# Patient Record
Sex: Female | Born: 1975 | Race: Black or African American | Hispanic: No | Marital: Single | Smoking: Never smoker
Health system: Southern US, Community
[De-identification: ages and names within clinical notes are randomized; demographics above are authoritative.]

---

## 2008-08-30 ENCOUNTER — Encounter: Payer: Self-pay | Admitting: Obstetrics & Gynecology

## 2008-08-30 ENCOUNTER — Inpatient Hospital Stay (HOSPITAL_COMMUNITY): Admission: AD | Admit: 2008-08-30 | Discharge: 2008-08-30 | Payer: Self-pay | Admitting: Obstetrics & Gynecology

## 2008-09-06 ENCOUNTER — Inpatient Hospital Stay (HOSPITAL_COMMUNITY): Admission: AD | Admit: 2008-09-06 | Discharge: 2008-09-06 | Payer: Self-pay | Admitting: Obstetrics & Gynecology

## 2009-05-10 ENCOUNTER — Inpatient Hospital Stay (HOSPITAL_COMMUNITY): Admission: AD | Admit: 2009-05-10 | Discharge: 2009-05-10 | Payer: Self-pay | Admitting: Family Medicine

## 2009-05-24 ENCOUNTER — Ambulatory Visit: Payer: Self-pay | Admitting: Advanced Practice Midwife

## 2009-05-24 ENCOUNTER — Inpatient Hospital Stay (HOSPITAL_COMMUNITY): Admission: AD | Admit: 2009-05-24 | Discharge: 2009-05-24 | Payer: Self-pay | Admitting: Obstetrics and Gynecology

## 2010-05-08 LAB — URINALYSIS, ROUTINE W REFLEX MICROSCOPIC
Bilirubin Urine: NEGATIVE
Protein, ur: NEGATIVE mg/dL
Specific Gravity, Urine: 1.02 (ref 1.005–1.030)

## 2010-05-08 LAB — CBC
HCT: 41.9 % (ref 36.0–46.0)
Hemoglobin: 14 g/dL (ref 12.0–15.0)
MCHC: 33.5 g/dL (ref 30.0–36.0)
Platelets: 281 10*3/uL (ref 150–400)
RBC: 4.27 MIL/uL (ref 3.87–5.11)

## 2010-05-08 LAB — URINE MICROSCOPIC-ADD ON

## 2010-05-08 LAB — HCG, QUANTITATIVE, PREGNANCY: hCG, Beta Chain, Quant, S: 61884 m[IU]/mL — ABNORMAL HIGH (ref <5)

## 2010-05-08 LAB — POCT PREGNANCY, URINE: Preg Test, Ur: POSITIVE

## 2010-05-21 LAB — WET PREP, GENITAL
Trich, Wet Prep: NONE SEEN
Yeast Wet Prep HPF POC: NONE SEEN

## 2010-05-21 LAB — CBC
Hemoglobin: 13.6 g/dL (ref 12.0–15.0)
MCV: 97.2 fL (ref 78.0–100.0)
Platelets: 304 10*3/uL (ref 150–400)
RBC: 4.08 MIL/uL (ref 3.87–5.11)
RDW: 12.5 % (ref 11.5–15.5)

## 2010-05-21 LAB — GC/CHLAMYDIA PROBE AMP, GENITAL: Chlamydia, DNA Probe: NEGATIVE

## 2013-05-14 ENCOUNTER — Emergency Department (HOSPITAL_COMMUNITY)
Admission: EM | Admit: 2013-05-14 | Discharge: 2013-05-15 | Disposition: A | Discharge status: Home / Self Care | Payer: BC Managed Care – PPO | Attending: Emergency Medicine | Admitting: Emergency Medicine

## 2013-05-14 ENCOUNTER — Emergency Department (HOSPITAL_COMMUNITY): Payer: BC Managed Care – PPO

## 2013-05-14 ENCOUNTER — Encounter (HOSPITAL_COMMUNITY): Payer: Self-pay | Admitting: Emergency Medicine

## 2013-05-14 DIAGNOSIS — R6883 Chills (without fever): Secondary | ICD-10-CM | POA: Insufficient documentation

## 2013-05-14 DIAGNOSIS — R079 Chest pain, unspecified: Secondary | ICD-10-CM | POA: Insufficient documentation

## 2013-05-14 DIAGNOSIS — M542 Cervicalgia: Secondary | ICD-10-CM | POA: Insufficient documentation

## 2013-05-14 DIAGNOSIS — Z880 Allergy status to penicillin: Secondary | ICD-10-CM | POA: Insufficient documentation

## 2013-05-14 DIAGNOSIS — R0602 Shortness of breath: Secondary | ICD-10-CM | POA: Insufficient documentation

## 2013-05-14 DIAGNOSIS — R51 Headache: Secondary | ICD-10-CM | POA: Insufficient documentation

## 2013-05-14 DIAGNOSIS — Z3202 Encounter for pregnancy test, result negative: Secondary | ICD-10-CM | POA: Insufficient documentation

## 2013-05-14 DIAGNOSIS — Z791 Long term (current) use of non-steroidal anti-inflammatories (NSAID): Secondary | ICD-10-CM | POA: Insufficient documentation

## 2013-05-14 DIAGNOSIS — R519 Headache, unspecified: Secondary | ICD-10-CM

## 2013-05-14 DIAGNOSIS — E669 Obesity, unspecified: Secondary | ICD-10-CM | POA: Insufficient documentation

## 2013-05-14 LAB — BASIC METABOLIC PANEL
BUN: 13 mg/dL (ref 6–23)
CALCIUM: 9.4 mg/dL (ref 8.4–10.5)
CO2: 23 meq/L (ref 19–32)
Chloride: 103 mEq/L (ref 96–112)
Creatinine, Ser: 0.96 mg/dL (ref 0.50–1.10)
GFR, EST AFRICAN AMERICAN: 86 mL/min — AB (ref >90)
GFR, EST NON AFRICAN AMERICAN: 74 mL/min — AB (ref >90)
GLUCOSE: 108 mg/dL — AB (ref 70–99)
POTASSIUM: 3.8 meq/L (ref 3.7–5.3)
SODIUM: 139 meq/L (ref 137–147)

## 2013-05-14 LAB — CBC
HEMATOCRIT: 41.1 % (ref 36.0–46.0)
Hemoglobin: 14.2 g/dL (ref 12.0–15.0)
MCH: 32.6 pg (ref 26.0–34.0)
MCHC: 34.5 g/dL (ref 30.0–36.0)
MCV: 94.3 fL (ref 78.0–100.0)
Platelets: 320 10*3/uL (ref 150–400)
RBC: 4.36 MIL/uL (ref 3.87–5.11)
RDW: 12.4 % (ref 11.5–15.5)
WBC: 8.7 10*3/uL (ref 4.0–10.5)

## 2013-05-14 LAB — POC URINE PREG, ED: PREG TEST UR: NEGATIVE

## 2013-05-14 MED ORDER — MORPHINE SULFATE 4 MG/ML IJ SOLN
4.0000 mg | Freq: Once | INTRAMUSCULAR | Status: AC
  Administered 2013-05-15: 4 mg via INTRAVENOUS
  Filled 2013-05-14: qty 1

## 2013-05-14 MED ORDER — ASPIRIN 325 MG PO TABS
325.0000 mg | ORAL_TABLET | ORAL | Status: AC
  Administered 2013-05-14: 325 mg via ORAL
  Filled 2013-05-14: qty 1

## 2013-05-14 NOTE — ED Notes (Signed)
Patient presents with c/o left chest pain that started a short time ago,  Woke up with a headache about 5p.  Now c/o left chest pain that feels better when she pushes on it.  Also states that her left arm ws feeling cold

## 2013-05-14 NOTE — ED Provider Notes (Signed)
CSN: [id number]     Arrival date & time 05/14/13  1938 History   First MD Initiated Contact with Patient 05/14/13 2334     Chief Complaint  Patient presents with  . Chest Pain     (Consider location/radiation/quality/duration/timing/severity/associated sxs/prior Treatment) HPI Comments: 38 year old female, known medical history other than a penicillin allergy presents with a complaint of left-sided chest pain. This started approximately 4 hours ago when the patient arrived to work at the hospital. 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 She has also had some neck pain on the left. When she arrived at work and got settled and she developed acute onset of left upper chest pain described as a sharp and stabbing pain with some radiation to her back, associated shortness of breath and some abnormal sensations in her left arm including feeling like it was cold. She denies any cardiac risk factors other than family history as her father and mother both have cardiac disease, her other had cardiac disease at an early age. She has no risk factors for pulmonary embolism other than arthroscopically on her knee in August of 2014. She has no abdominal pain, no nausea or vomiting, no diaphoresis, denies swelling of the legs, rashes, diarrhea, dysuria, cough or fever. The symptoms have been persistent over the last 4 hours, it does seem to get worse with taking a deep breath.  Patient is a 38 y.o. female presenting with chest pain. The history is provided by the patient.  Chest Pain   History reviewed. No pertinent past medical history. History reviewed. No pertinent past surgical history. History reviewed. No pertinent family history. History  Substance Use Topics  . Smoking status: Never Smoker   . Smokeless tobacco: Never Used  . Alcohol Use: No   OB History   Grav Para Term Preterm  Abortions TAB SAB Ect Mult Living                 Review of Systems  Cardiovascular: Positive for chest pain.  All other systems reviewed and are negative.      Allergies  Penicillins  Home Medications   Current Outpatient Rx  Name  Route  Sig  Dispense  Refill  . naproxen (NAPROSYN) 500 MG tablet   Oral   Take 1 tablet (500 mg total) by mouth 2 (two) times daily with a meal.   30 tablet   0    BP 118/72  Pulse 63  Temp(Src) 98.2 F (36.8 C) (Oral)  Resp 22  Ht 5\' 6"  (1.676 m)  Wt 240 lb (108.863 kg)  BMI 38.76 kg/m2  SpO2 100% Physical Exam  Nursing note and vitals reviewed. Constitutional: She appears well-developed and well-nourished. No distress.  HENT:  Head: Normocephalic and atraumatic.  Mouth/Throat: Oropharynx is clear and moist. No oropharyngeal exudate.  Eyes: Conjunctivae and EOM are normal. Pupils are equal, round, and reactive to light. Right eye exhibits no discharge. Left eye exhibits no discharge. No scleral icterus.  Neck: Normal range of motion. Neck supple. No JVD present. No thyromegaly present.  Cardiovascular: Normal rate, regular rhythm, normal heart sounds and intact distal pulses.  Exam reveals no gallop and no friction rub.   No murmur heard. Pulmonary/Chest: Effort normal and breath sounds normal. No respiratory distress. She has no wheezes. She has no rales.  The patient is unable to take deep breaths  on exam secondary to reproducible pain  Abdominal: Soft. Bowel sounds are normal. She exhibits no distension and no mass. There is no tenderness.  Musculoskeletal: Normal range of motion. She exhibits no edema and no tenderness.  Lymphadenopathy:    She has no cervical adenopathy.  Neurological: She is alert. Coordination normal.  Skin: Skin is warm and dry. No rash noted. No erythema.  Psychiatric: She has a normal mood and affect. Her behavior is normal.    ED Course  Procedures (including critical care time) Labs Review Labs  Reviewed  BASIC METABOLIC PANEL - Abnormal; Notable for the following:    Glucose, Bld 108 (*)    GFR calc non Af Amer 74 (*)    GFR calc Af Amer 86 (*)    All other components within normal limits  CBC  I-STAT TROPOININ, ED  POC URINE PREG, ED   Imaging Review Dg Chest 2 View  05/14/2013   CLINICAL DATA:  Midsternal pain, short of breath  EXAM: CHEST  2 VIEW  COMPARISON:  None.  FINDINGS: Nonspecific subtle patchy opacity in the right lower lobe. Inspiratory volumes are low. Cardiac and mediastinal contours are within normal limits mild central airway thickening/ peribronchial cuffing. No pleural effusion or pneumothorax. No acute osseous abnormality.  IMPRESSION: 1. Low inspiratory volumes with nonspecific right lower lobe patchy opacity. Differential considerations include both infiltrate/pneumonia and atelectasis given the overall low lung volumes. 2. Mild central airway thickening and peribronchial cuffing. Differential considerations include bronchitis, viral respiratory illness and inflammatory conditions such as asthma.   Electronically Signed   By: Malachy Moan M.D.   On: 05/14/2013 20:49   Ct Angio Chest Pe W/cm &/or Wo Cm  05/15/2013   CLINICAL DATA:  Left chest pain, headache.  EXAM: CT ANGIOGRAPHY CHEST WITH CONTRAST  TECHNIQUE: Multidetector CT imaging of the chest was performed using the standard protocol during bolus administration of intravenous contrast. Multiplanar CT image reconstructions and MIPs were obtained to evaluate the vascular anatomy.  CONTRAST:  OMNIPAQUE IOHEXOL 350 MG/ML SOLN  COMPARISON:  DG CHEST 2 VIEW dated 05/14/2013  FINDINGS: Adequate contrast opacification of the pulmonary artery's. Mild respiratory motion degraded examination. Main pulmonary artery is not enlarged. No pulmonary arterial filling defects to the level of the subsegmental branches.  Heart and pericardium are unremarkable, no right heart strain. Thoracic aorta is normal course and caliber,  unremarkable. No lymphadenopathy by CT size criteria. Tracheobronchial tree is patent, no pneumothorax. No pleural effusions, focal consolidations, pulmonary nodules or masses. Minimal dependent atelectasis.  Included view of the abdomen is unremarkable. Visualized soft tissues and included osseous structures appear normal.  Review of the MIP images confirms the above findings.  IMPRESSION: No pulmonary embolism nor acute cardiopulmonary process on this respiratory motion degraded examination.   Electronically Signed   By: Awilda Metro   On: 05/15/2013 02:08     EKG Interpretation   Date/Time:  Thursday May 14 2013 19:39:04 EDT Ventricular Rate:  83 PR Interval:  132 QRS Duration: 74 QT Interval:  358 QTC Calculation: 420 R Axis:   54 Text Interpretation:  Normal sinus rhythm Normal ECG No old tracing to  compare Confirmed by Analyse Angst  MD, Kadelyn Dimascio ([id number]) on 05/14/2013 11:35:46 PM      MDM   Final diagnoses:  Chest pain  Headache    The patient is obese, she has chest pain with some shortness of breath and some abnormal findings in her left arm including the fact it  is slightly cold to touch. The pulse seems slightly weaker on the left, the patient is low risk for cardiac disease, pulmonary embolism and aortic dissection however I do not have a cohesive diagnosis membranes and all of these symptoms. CT scan will be ordered to rule out pathologic sources of chest pain.  Pregnant Test negative  CBC normal  Basic metabolic panel normal  Chest x-ray:  Possible subtle right lower lobe infiltrate, the patient again has no coughing or fevers and her symptoms are left-sided. CT scan will further evaluate this finding.  CT neg for infiltrate or aortic / PE findings. Stable for d/c.  Very low risk for ACS.  Pt informed and is in agreement.  Meds given in ED:  Medications  aspirin tablet 325 mg (325 mg Oral Given 05/14/13 1957)  morphine 4 MG/ML injection 4 mg (4 mg Intravenous Given  05/15/13 0105)  iohexol (OMNIPAQUE) 350 MG/ML injection 100 mL (100 mLs Intravenous Contrast Given 05/15/13 0132)    New Prescriptions   NAPROXEN (NAPROSYN) 500 MG TABLET    Take 1 tablet (500 mg total) by mouth 2 (two) times daily with a meal.        Vida RollerBrian D Ceaser Ebeling, MD 05/15/13 [phone]

## 2013-05-15 ENCOUNTER — Emergency Department (HOSPITAL_COMMUNITY): Payer: BC Managed Care – PPO

## 2013-05-15 ENCOUNTER — Encounter (HOSPITAL_COMMUNITY): Payer: Self-pay | Admitting: Radiology

## 2013-05-15 LAB — I-STAT TROPONIN, ED: TROPONIN I, POC: 0 ng/mL (ref 0.00–0.08)

## 2013-05-15 MED ORDER — NAPROXEN 500 MG PO TABS: 500.0000 mg | ORAL_TABLET | Freq: Two times a day (BID) | ORAL | Status: AC

## 2013-05-15 MED ORDER — NAPROXEN 250 MG PO TABS
500.0000 mg | ORAL_TABLET | Freq: Once | ORAL | Status: AC
  Administered 2013-05-15: 500 mg via ORAL
  Filled 2013-05-15: qty 2

## 2013-05-15 MED ORDER — IOHEXOL 350 MG/ML SOLN
100.0000 mL | Freq: Once | INTRAVENOUS | Status: AC | PRN
  Administered 2013-05-15: 100 mL via INTRAVENOUS

## 2013-05-15 NOTE — ED Notes (Signed)
This RN unable to obtain IV access. Sharrie RothmanKim K, RN to attempt.

## 2013-05-15 NOTE — Discharge Instructions (Signed)
°Emergency Department Resource Guide °1) Find a Doctor and Pay Out of Pocket °Although you won't have to find out who is covered by your insurance plan, it is a good idea to ask around and get recommendations. You will then need to call the office and see if the doctor you have chosen will accept you as a new patient and what types of options they offer for patients who are self-pay. Some doctors offer discounts or will set up payment plans for their patients who do not have insurance, but you will need to ask so you aren't surprised when you get to your appointment. ° °2) Contact Your Local Health Department °Not all health departments have doctors that can see patients for sick visits, but many do, so it is worth a call to see if yours does. If you don't know where your local health department is, you can check in your phone book. The CDC also has a tool to help you locate your state's health department, and many state websites also have listings of all of their local health departments. ° °3) Find a Walk-in Clinic °If your illness is not likely to be very severe or complicated, you may want to try a walk in clinic. These are popping up all over the country in pharmacies, drugstores, and shopping centers. They're usually staffed by nurse practitioners or physician assistants that have been trained to treat common illnesses and complaints. They're usually fairly quick and inexpensive. However, if you have serious medical issues or chronic medical problems, these are probably not your best option. ° °No Primary Care Doctor: °- Call [facility] at  832-8000 - they can help you locate a primary care doctor that  accepts your insurance, provides certain services, etc. °- Physician Referral Service- 1-800-533-3463 ° °Chronic Pain Problems: °Organization         Address  Phone   Notes  °[facility]  (336) [phone] Patients need to be referred by their primary care doctor.  ° °Medication  Assistance: °Organization         Address  Phone   Notes  °[place] [facility] [address] °[place], [place] [address] (336) 641-8030 --Must be a resident of [place] °-- Must have NO insurance coverage whatsoever (no Medicaid/ Medicare, etc.) °-- The pt. MUST have a primary care doctor that directs their care regularly and follows them in the community °  °[facility]  (866) 331-1348   °[employer]  (888) 892-1162   ° °Agencies that provide inexpensive medical care: °Organization         Address  Phone   Notes  °[facility]  (336) 832-8035   °[facility]    (336) 832-7272   °[facility] [address] °[place], [place] [address] (336) 832-4777   °[facility] [place] [address], °[place] (336) 271-4999   °[facility]    (336) 373-0678   °[facility]    (336) 272-1050   °[facility] ° [address], [place] Phone:  (336) 832-4444, Fax:  (336) 832-4440 Hours of Operation:  9 am - 6 pm, M-F.  Also accepts Medicaid/Medicare and self-pay.  °[facility] ° [address], [place] Phone: (336) 832-3150, Fax: (336) [phone]. Hours of Operation:  8:30 am - 5:30 pm, M-F.  Also accepts Medicaid and self-pay.  °[facility] [place] [address], [place] Phone: (336) 878-6027   °[facility] [address], [place], [place] (336)723-1848, Ext. 123 Mondays & Thursdays: 7-9 AM.  First 15 patients are seen on a first come, first serve basis. °  ° °Medicaid-accepting [place] Providers: ° °Organization         Address  Phone   Notes  °[facility] [address], [place] (336) 641-2100 Also accepts self-pay patients.  °[facility] [address], [place] ° (336) 856-9996   °[facility] [address], [place]  (336) 288-8857   °[facility] [address] [place] [address], [place] (336) 299-7000   °Veita Bland [address], [place]  ° (336) 373-1557 Only accepts [place] Access Medicaid patients after they have their name applied to their card.  ° °Self-Pay (no insurance) in [place]: ° °Organization         Address  Phone   Notes  °Sickle Cell Patients, [facility] [address], [place] (336) 832-1970   °[facility] [address], [place] (336) 832-4400   °[facility] ° [address], [place] (336) 992-4800   °[facility]/Dr. Osei-Bonsu ° [address] [place] [address], [place] or [address], [place] (336) [phone] Phone number for both [place] and [place] locations is the same.  °[facility] [address], [place] (336) 299-0000   °[facility] [address] [place] [address], [place] or [address] (336) 852-7530 °(336) 878-2260   °[facility] [address], [place] (336) 350-1642, phone; (336) 294-5005, fax Sees patients 1st and 3rd Saturday of every month.  Must not qualify for public or private insurance (i.e. Medicaid, Medicare, [place] Health Choice, Veterans' Benefits) • Household income should be no more than 200% of the poverty level •The clinic cannot treat you if you are pregnant or think you are pregnant • Sexually transmitted diseases are not treated at the clinic.  ° ° °Dental Care: °Organization         Address  Phone  Notes  °[place] [facility] [address], [place] (336) 641-6152 Accepts children up to age 21 who are enrolled in Medicaid or [place] Health Choice; pregnant women with a Medicaid card; and children who have applied for Medicaid or [place] Health Choice, but were declined, whose parents can pay a reduced fee at time of service.  °[place]  [facility] [place]  [address], [place] (336) 641-7733 Accepts children up to age 21 who are enrolled in Medicaid or [place] Health Choice; pregnant women with a Medicaid card; and children who have applied for Medicaid or [place] Health Choice, but were declined, whose parents can pay a reduced fee at time of service.  °[facility] ° [address],  (336) 641-4533 Patients are seen by appointment only. Walk-ins are not accepted. [facility] will see patients 18 years of age and older. °Monday - Tuesday (8am-5pm) °Most Wednesdays (8:30-5pm) °$30 per visit, cash only  °[facility] ° [address], [place] (336) 641-4533 Patients are seen by appointment only. Walk-ins are not accepted. [facility] will see patients 18 years of age and older. °One   Wednesday Evening (Monthly: Volunteer Based).  $30 per visit, cash only  °[facility]  (919) 537-3737 for adults; Children under age 4, call Graduate Pediatric Dentistry at ([phone]) [phone]. Children aged 4-14, please call ([phone]) [phone] to request a pediatric application. ° Dental services are provided in all areas of dental care including fillings, crowns and bridges, complete and partial dentures, implants, gum treatment, root canals, and extractions. Preventive care is also provided. Treatment is provided to both adults and children. °Patients are selected via a lottery and there is often a waiting list. °  °[facility] [address], °[place] ° (336) 763-8833 [email or web address] °  °[facility] [address], [place], [place] (336)723-1848, Ext. 123 Second and Fourth Thursday of each month, opens at 6:30 AM; Clinic ends at 9 AM.  Patients are seen on a first-come first-served basis, and a limited number are seen during each clinic.  ° °[facility] ° [address], [place], [place] (336) 723-7904    Eligibility Requirements °You must have lived in [place], [place], or [place] counties for at least the last three months. °  You cannot be eligible for state or federal sponsored healthcare insurance, including Veterans Administration, Medicaid, or Medicare. °  You generally cannot be eligible for healthcare insurance through your employer.  °  How to apply: °Eligibility screenings are held every Tuesday and Wednesday afternoon from 1:00 pm until 4:00 pm. You do not need an appointment for the interview!  °[facility] [address], [place], [place] 336-631-2330   °[facility] [facility]  336-342-8273   °[facility]  336-703-3100   °[facility]  336-570-6415   ° °Behavioral Health Resources in the Community: °Intensive Outpatient Programs °Organization         Address  Phone  Notes  °[facility] [address], [place], [place] 336-878-6098   °[facility] [address], [place], [place] 336-832-9800   °[facility] [address], [place], [place] ° 336-882-2125   °[facility] [address],  °[place], [place] 1-800-853-5163 or 336-641-4981   °Substance Abuse Resources °Organization         Address  Phone  Notes  °[facility]  336-882-2125   °[facility]  336-784-9470   °[facility]  336-285-9073   °[facility]  336-845-3988   °Residential & Outpatient Substance Abuse Program  1-800-659-3381   °Psychological Services °Organization         Address  Phone  Notes  °[facility]  336- 832-9600   °[facility]  336- 378-7881   °[facility] [address], [place] 1-800-853-5163 or 336-641-4981   ° °Mobile Crisis Teams °Organization         Address  Phone  Notes  °[facility], Mobile Crisis Care Unit  1-877-626-1772   °Assertive °[facility] ° [address]  [place], [place] 336-834-9664   °Sharon DeEsch [address] °[place] 336-554-5454   ° °Self-Help/Support Groups °Organization         Address  Phone             Notes  °[facility]  - variety of support groups  336- 373-1402 Call for more information  °Narcotics Anonymous (NA), [facility] [address], °[place]   2 meetings at this location  ° °  Residential Treatment Programs °Organization         Address  Phone  Notes  °[facility] [address],    °[place]  1-866-801-8205   °[facility] ° [address], [place], [place] 704-293-8524   °[facility] [address], [place] 336-845-3988 Admissions: 8am-3pm M-F  °[facility] [address]    °[place], [place] 336-841-1104   °[facility] [address], [place], [place] 336-379-7146   °[facility] [address]  °[place], [place] 336-285-9073   °[facility] - Intensive Outpatient [address], [place], [place] 336-852-3033   °[facility] [address]  °[place], [place] 1-877-615-2722 or 336-784-9470   °[facility] [address] [place], [place] 336-227-7417 Accepts Medicaid  °[facility] [address]  °[place] 1-800-659-3381 Substance Abuse/Addiction Treatment  ° °[place] Behavioral Health Resources °Organization         Address  Phone  Notes  °[facility]  (888) 581-9988   °Julie Brannon, PhD [address] [place], [place]   (336) 349-5553 or (336) 951-0000   ° Behavioral   [address] °[place], [place] (336) 349-4454   °[facility] [address], [place], [place] (336) 342-8316 Insurance/Medicaid/sponsorship through [facility]  °[facility] [address]                                    [place], [place] (336) 342-8316 Therapy/tele-psych/case    °[facility] [address]  ° [place], [place] (336) 349-2233    °Dr. Arfeen  (336) 349-4544   °[facility] [place]  [employer] [place] [facility] 1) [address], [place] °2) [address], [place] °3)  [address], [place] (336) 349-3220 °(336) 342-7768 ° °(336) 342-8140   °[place] [facility] (336) 342-1394 or (336) 342-3537 (After Hours)    ° ° °

## 2013-11-26 ENCOUNTER — Other Ambulatory Visit (HOSPITAL_COMMUNITY): Payer: Self-pay | Admitting: Family Medicine

## 2013-11-26 ENCOUNTER — Ambulatory Visit (HOSPITAL_COMMUNITY)
Admission: RE | Admit: 2013-11-26 | Discharge: 2013-11-26 | Disposition: A | Discharge status: Home / Self Care | Payer: PRIVATE HEALTH INSURANCE | Source: Ambulatory Visit | Attending: Family Medicine | Admitting: Family Medicine

## 2013-11-26 DIAGNOSIS — R52 Pain, unspecified: Secondary | ICD-10-CM

## 2013-11-26 DIAGNOSIS — M25531 Pain in right wrist: Secondary | ICD-10-CM | POA: Diagnosis not present

## 2014-04-22 ENCOUNTER — Other Ambulatory Visit (HOSPITAL_COMMUNITY): Payer: Self-pay | Admitting: Sports Medicine

## 2014-04-22 DIAGNOSIS — M25531 Pain in right wrist: Secondary | ICD-10-CM

## 2014-05-11 ENCOUNTER — Ambulatory Visit (HOSPITAL_COMMUNITY)
Admission: RE | Admit: 2014-05-11 | Discharge: 2014-05-11 | Disposition: A | Discharge status: Home / Self Care | Payer: PRIVATE HEALTH INSURANCE | Source: Ambulatory Visit | Attending: Sports Medicine | Admitting: Sports Medicine

## 2014-05-11 DIAGNOSIS — M25561 Pain in right knee: Secondary | ICD-10-CM | POA: Diagnosis not present

## 2014-05-11 DIAGNOSIS — X58XXXA Exposure to other specified factors, initial encounter: Secondary | ICD-10-CM | POA: Diagnosis not present

## 2014-05-11 DIAGNOSIS — M25531 Pain in right wrist: Secondary | ICD-10-CM

## 2014-08-30 ENCOUNTER — Other Ambulatory Visit: Payer: Self-pay | Admitting: Occupational Medicine

## 2014-08-30 ENCOUNTER — Ambulatory Visit: Payer: Self-pay

## 2014-08-30 DIAGNOSIS — M25531 Pain in right wrist: Secondary | ICD-10-CM

## 2017-06-18 IMAGING — CR DG WRIST COMPLETE 3+V*R*
4 series · 4 of 4 positions shown · non-contrast
Comparison: November 26, 2013 radiographic examination; right wrist
MRI May 11, 2014

CLINICAL DATA: Pain following blunt trauma to right wrist

EXAM:
RIGHT WRIST - COMPLETE 3+ VIEW

[view not recorded (1 of 4)]
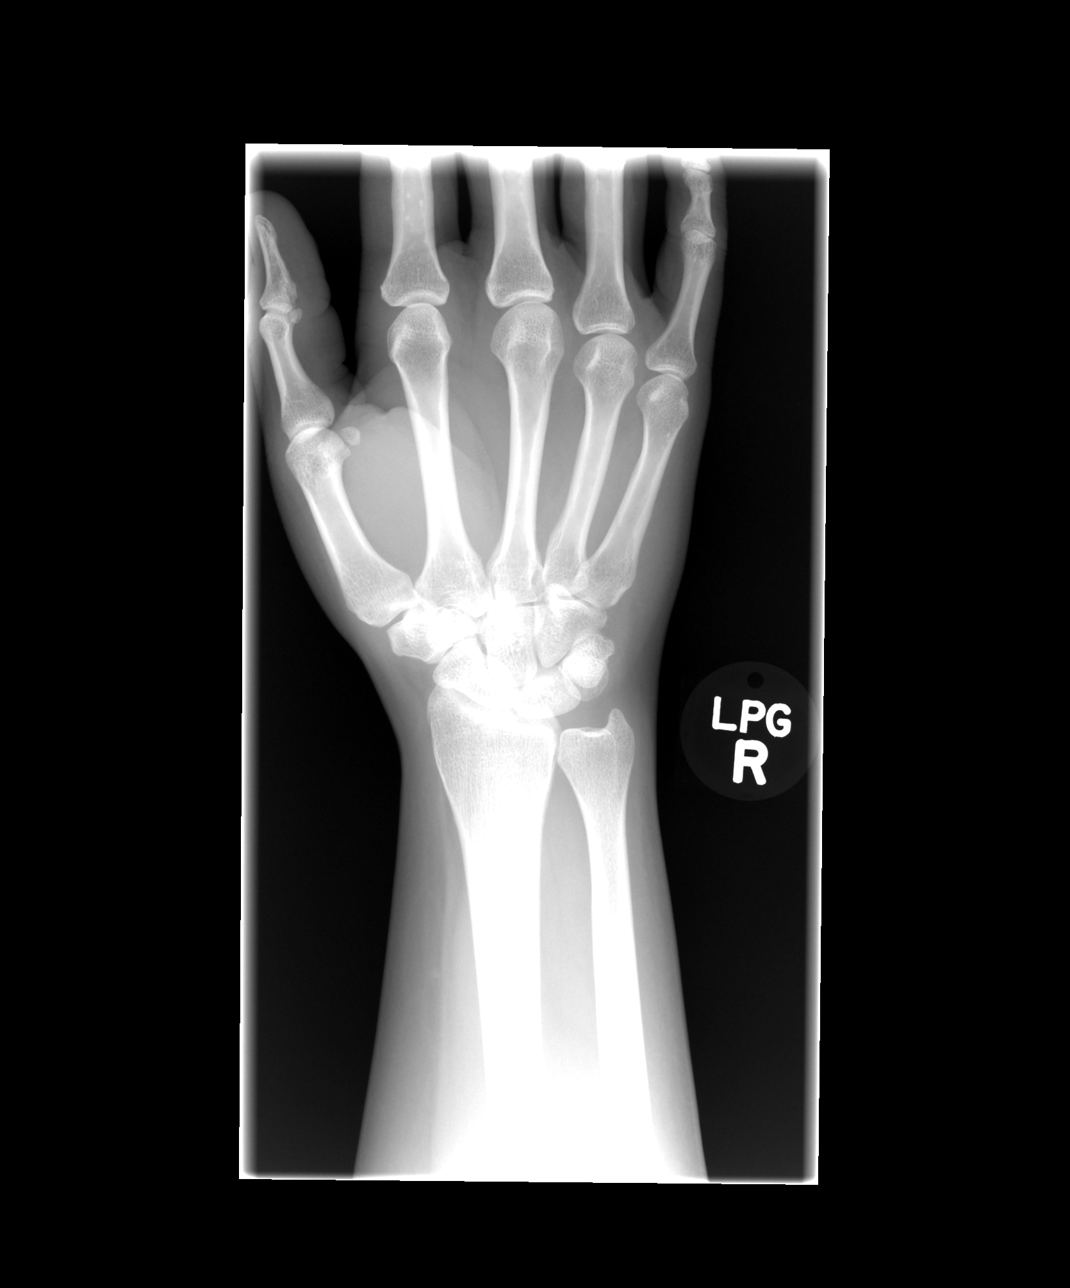

[view not recorded (2 of 4)]
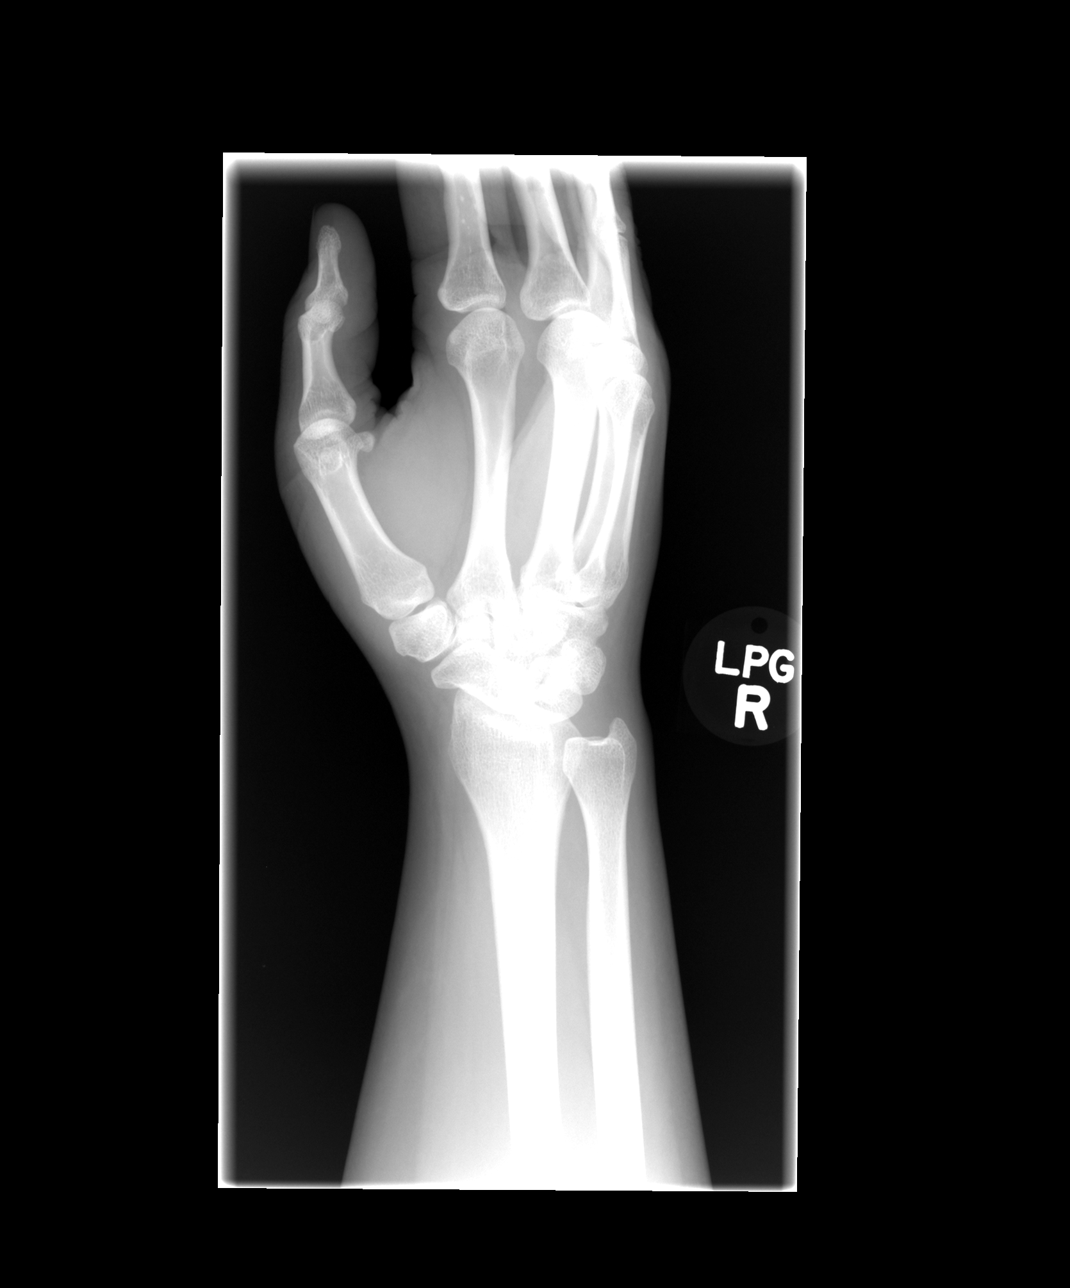

[view not recorded (3 of 4)]
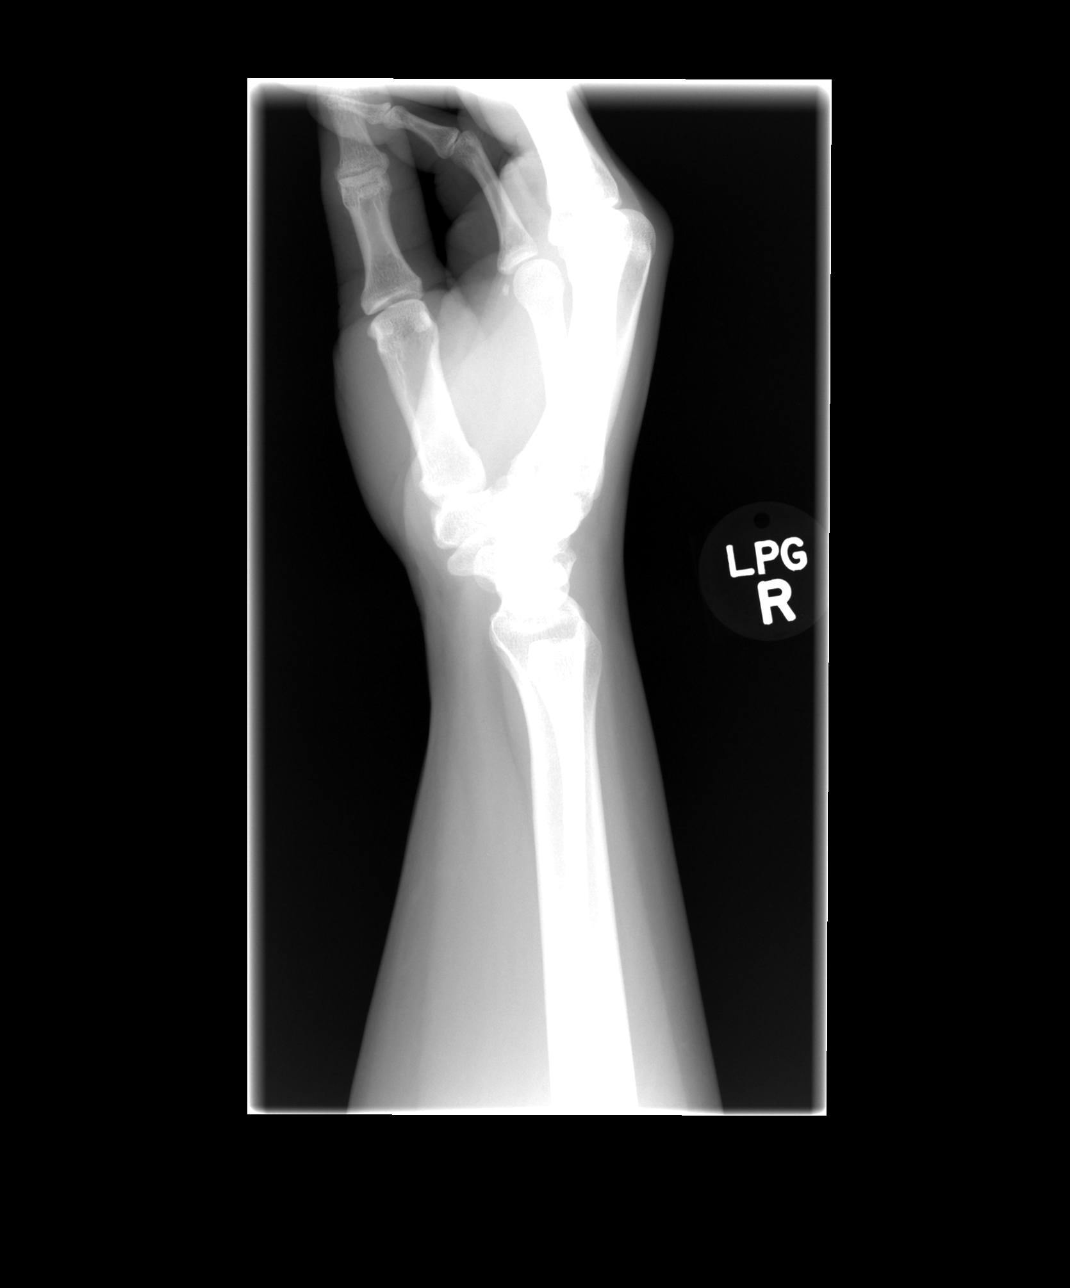

[view not recorded (4 of 4)]
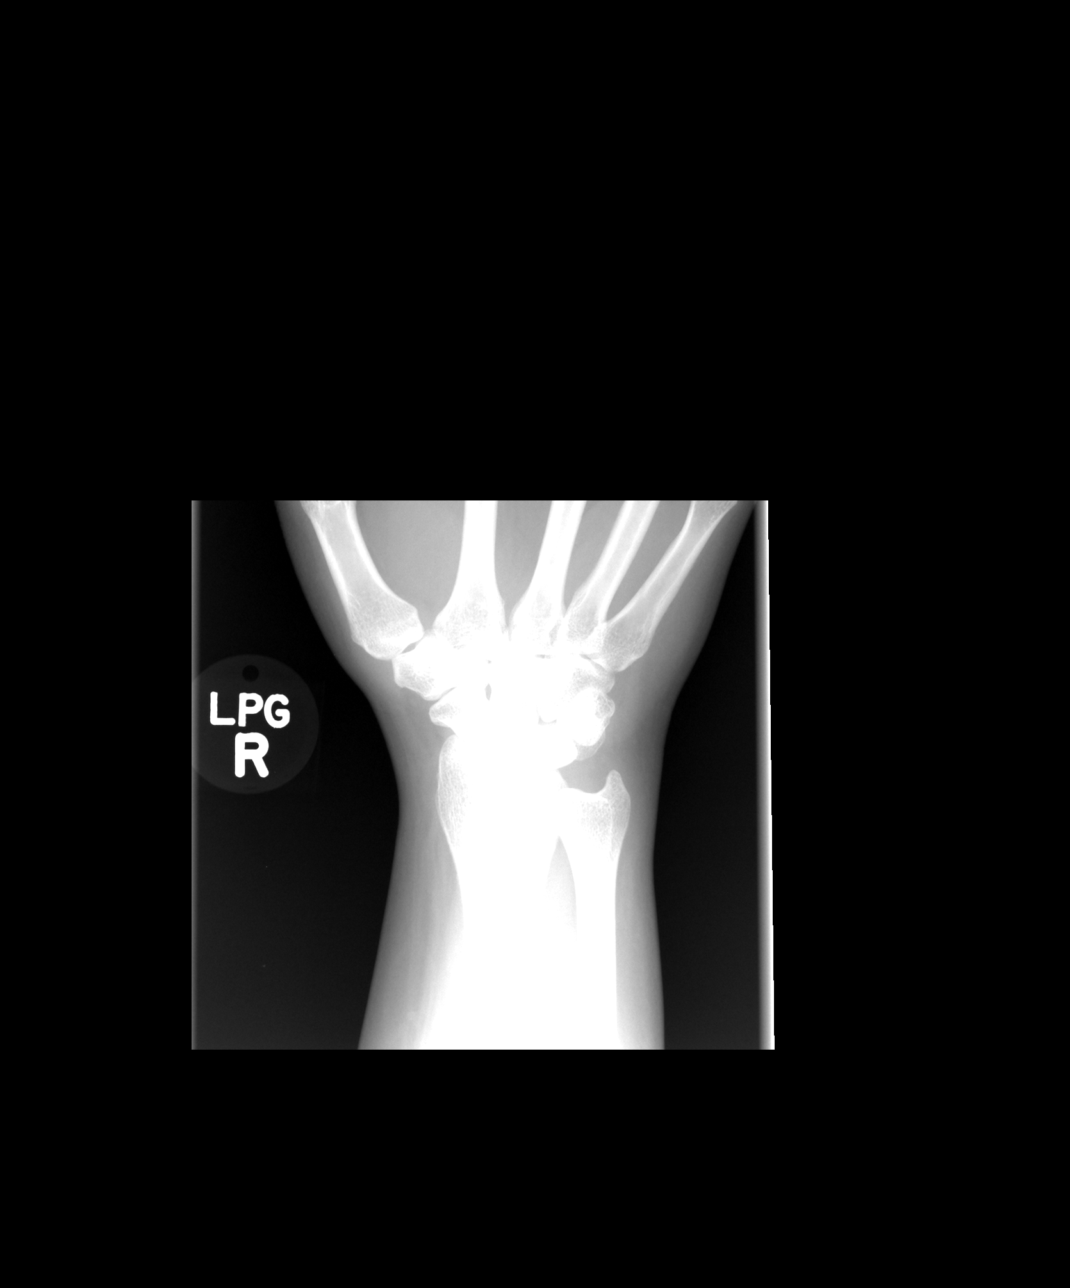

[4 of 4 positions shown; findings below may reference images not displayed]

FINDINGS: Frontal, oblique, lateral, and ulnar deviation scaphoid images were
obtained. There is no fracture or dislocation. Joint spaces appear
intact. No erosive change or intra-articular calcification.
IMPRESSION: No fracture or dislocation.  No appreciable arthropathy.
